# Patient Record
Sex: Female | Born: 1935 | Race: White | Hispanic: No | Marital: Married | Smoking: Never smoker
Health system: Southern US, Community
[De-identification: ages and names within clinical notes are randomized; demographics above are authoritative.]

## PROBLEM LIST (undated history)

## (undated) DIAGNOSIS — I83892 Varicose veins of left lower extremities with other complications: Secondary | ICD-10-CM

## (undated) DIAGNOSIS — E785 Hyperlipidemia, unspecified: Secondary | ICD-10-CM

## (undated) DIAGNOSIS — I6529 Occlusion and stenosis of unspecified carotid artery: Secondary | ICD-10-CM

## (undated) DIAGNOSIS — I10 Essential (primary) hypertension: Secondary | ICD-10-CM

## (undated) DIAGNOSIS — F419 Anxiety disorder, unspecified: Secondary | ICD-10-CM

## (undated) DIAGNOSIS — R011 Cardiac murmur, unspecified: Secondary | ICD-10-CM

## (undated) DIAGNOSIS — E041 Nontoxic single thyroid nodule: Secondary | ICD-10-CM

## (undated) DIAGNOSIS — M1990 Unspecified osteoarthritis, unspecified site: Secondary | ICD-10-CM

## (undated) DIAGNOSIS — E119 Type 2 diabetes mellitus without complications: Secondary | ICD-10-CM

## (undated) DIAGNOSIS — N189 Chronic kidney disease, unspecified: Secondary | ICD-10-CM

## (undated) DIAGNOSIS — C4491 Basal cell carcinoma of skin, unspecified: Secondary | ICD-10-CM

## (undated) HISTORY — DX: Chronic kidney disease, unspecified: N18.9

## (undated) HISTORY — DX: Essential (primary) hypertension: I10

## (undated) HISTORY — DX: Anxiety disorder, unspecified: F41.9

## (undated) HISTORY — DX: Type 2 diabetes mellitus without complications: E11.9

## (undated) HISTORY — DX: Varicose veins of left lower extremity with other complications: I83.892

## (undated) HISTORY — DX: Hyperlipidemia, unspecified: E78.5

## (undated) HISTORY — DX: Basal cell carcinoma of skin, unspecified: C44.91

## (undated) HISTORY — DX: Unspecified osteoarthritis, unspecified site: M19.90

## (undated) HISTORY — DX: Occlusion and stenosis of unspecified carotid artery: I65.29

## (undated) HISTORY — DX: Nontoxic single thyroid nodule: E04.1

## (undated) HISTORY — DX: Cardiac murmur, unspecified: R01.1

## (undated) HISTORY — PX: APPENDECTOMY: SHX54

## (undated) HISTORY — PX: TONSILLECTOMY: SUR1361

---

## 2010-03-09 ENCOUNTER — Encounter: Admission: RE | Admit: 2010-03-09 | Discharge: 2010-03-09 | Payer: Self-pay | Admitting: Neurological Surgery

## 2010-03-12 ENCOUNTER — Emergency Department (HOSPITAL_BASED_OUTPATIENT_CLINIC_OR_DEPARTMENT_OTHER): Admission: EM | Admit: 2010-03-12 | Discharge: 2010-03-12 | Payer: Self-pay | Admitting: Emergency Medicine

## 2010-04-12 ENCOUNTER — Ambulatory Visit (HOSPITAL_COMMUNITY): Admission: RE | Admit: 2010-04-12 | Discharge: 2010-04-13 | Payer: Self-pay | Admitting: Neurological Surgery

## 2011-02-05 LAB — CBC
HCT: 43.1 % (ref 36.0–46.0)
Hemoglobin: 14.8 g/dL (ref 12.0–15.0)
MCHC: 34.3 g/dL (ref 30.0–36.0)
MCV: 98.2 fL (ref 78.0–100.0)
WBC: 10 10*3/uL (ref 4.0–10.5)

## 2011-02-05 LAB — DIFFERENTIAL
Monocytes Absolute: 0.7 10*3/uL (ref 0.1–1.0)
Neutrophils Relative %: 76 % (ref 43–77)

## 2011-02-05 LAB — SURGICAL PCR SCREEN
MRSA, PCR: NEGATIVE
Staphylococcus aureus: NEGATIVE

## 2011-02-05 LAB — BASIC METABOLIC PANEL
BUN: 16 mg/dL (ref 6–23)
Glucose, Bld: 110 mg/dL — ABNORMAL HIGH (ref 70–99)

## 2011-02-05 LAB — PROTIME-INR
INR: 1 (ref 0.00–1.49)
Prothrombin Time: 13.1 seconds (ref 11.6–15.2)

## 2011-08-03 IMAGING — CT CT CERVICAL SPINE W/ CM
3 of 4 series · 15 of 28 positions shown, 17 images · non-contrast
Comparison: none

CLINICAL DATA: Neck pain.  Cervical radiculopathy.
TECHNIQUE: Contiguous axial images were obtained through the
Cervical spine without infusion. Coronal and sagittal
reconstructions were obtained of the axial image sets.

[Series 2: c spine bone · axial · 0.27mm/px · z∈[+12,+142]mm · 5 of 79 slices shown, 7 images]
[im 14/79  soft-tissue]
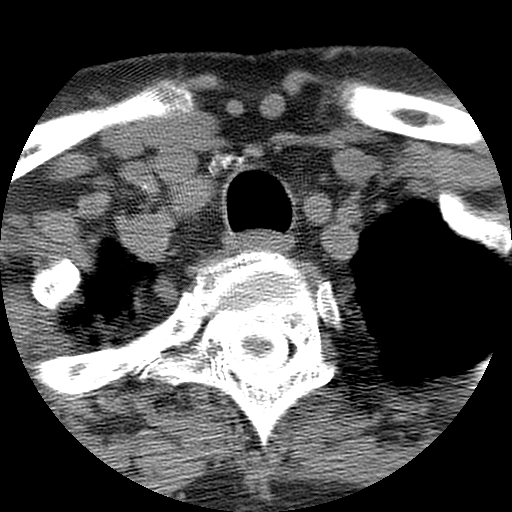
[im 14/79  bone]
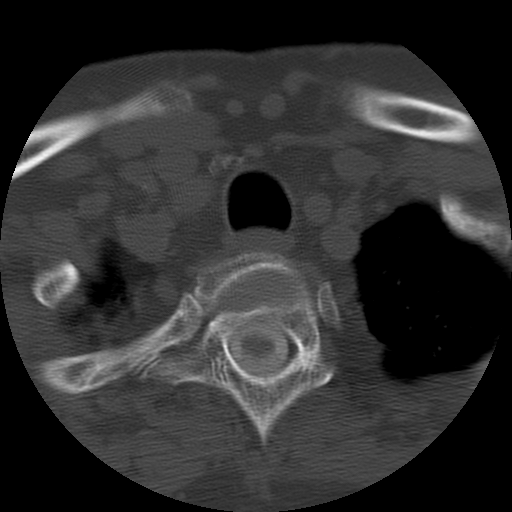
[im 27/79  bone]
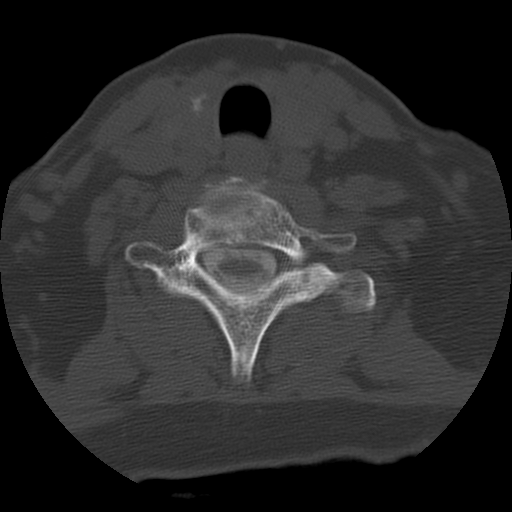
[im 40/79  bone]
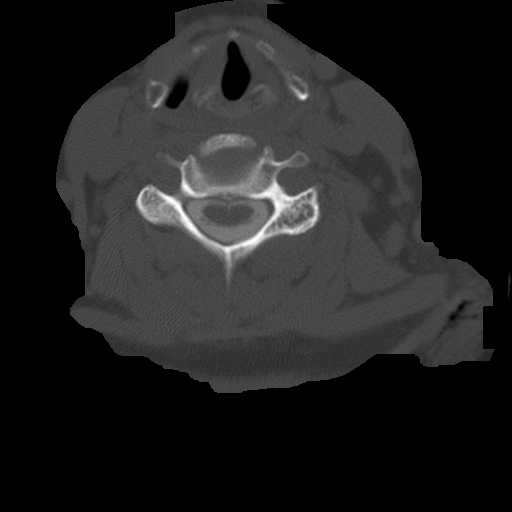
[im 53/79  bone]
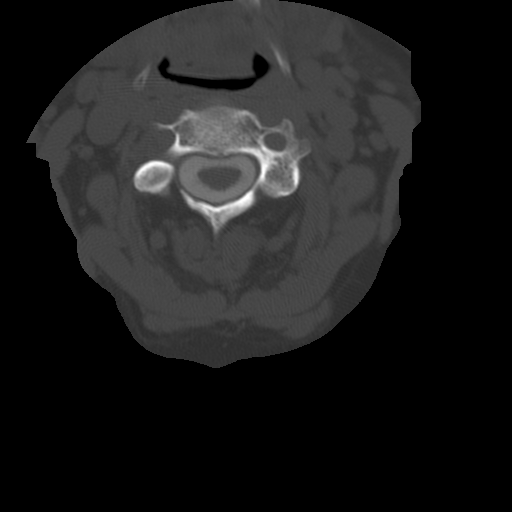
[im 66/79  soft-tissue]
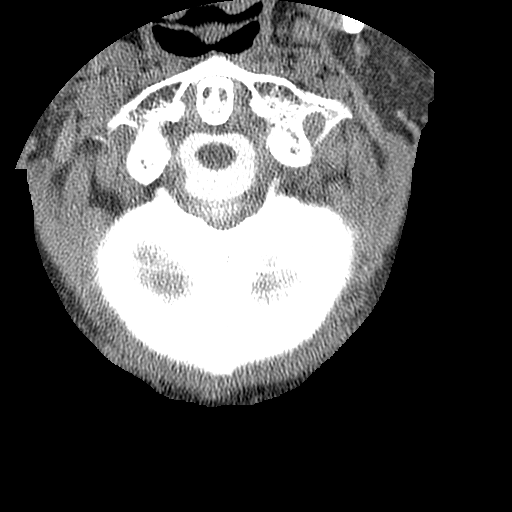
[im 66/79  bone]
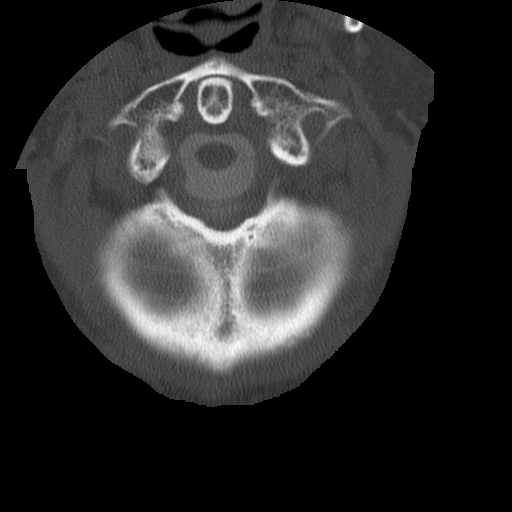

[Series 3: c spine soft · axial · 0.27mm/px · z∈[+12,+142]mm · 5 of 78 slices shown]
[im 13/78  soft-tissue]
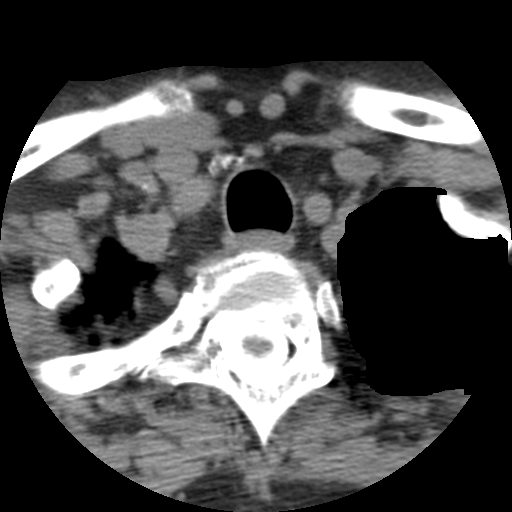
[im 26/78  soft-tissue]
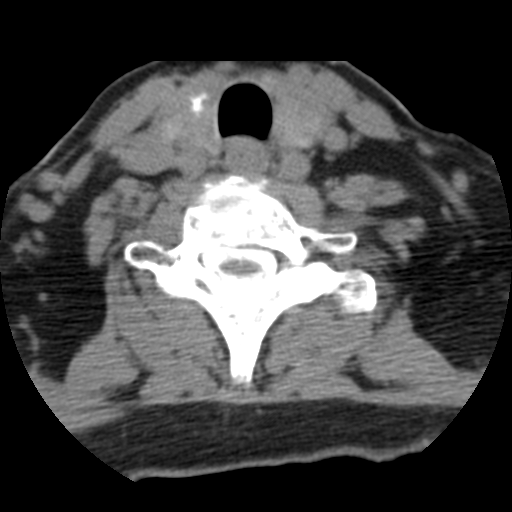
[im 39/78  soft-tissue]
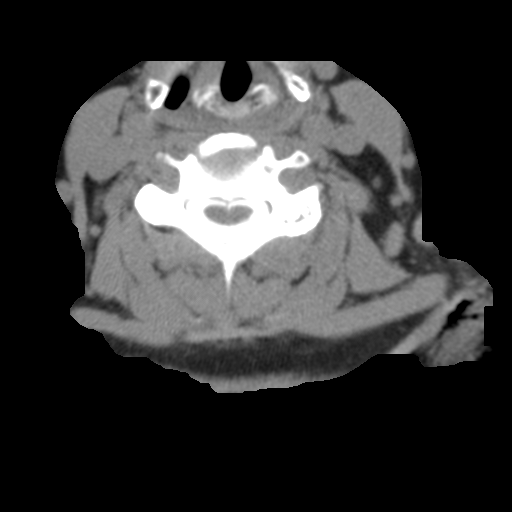
[im 52/78  soft-tissue]
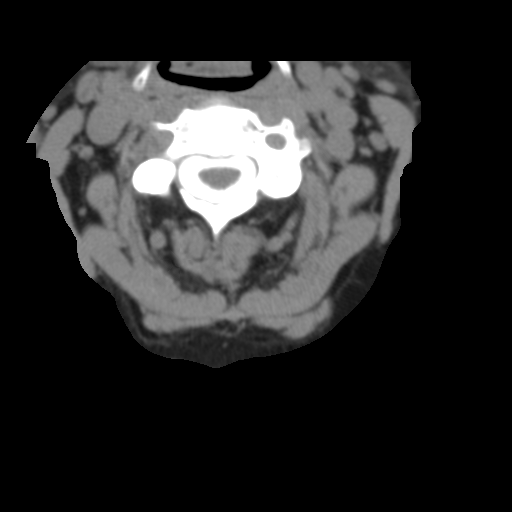
[im 65/78  soft-tissue]
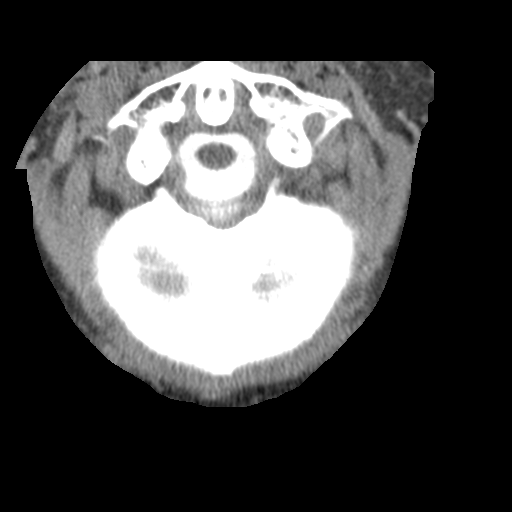

[Series 400: sag · sagittal · 0.39mm/px · 5 of 40 slices shown]
[im 7/40  bone]
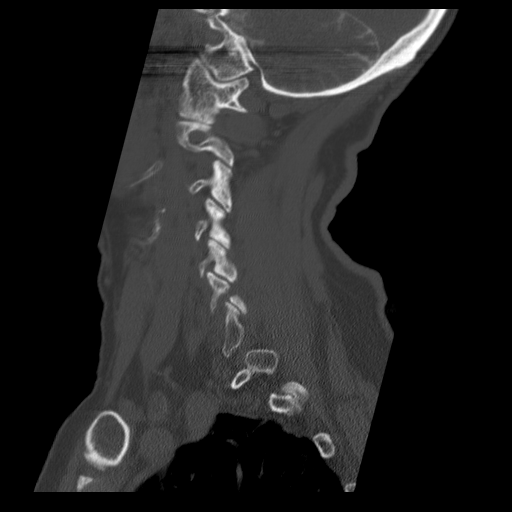
[im 14/40  bone]
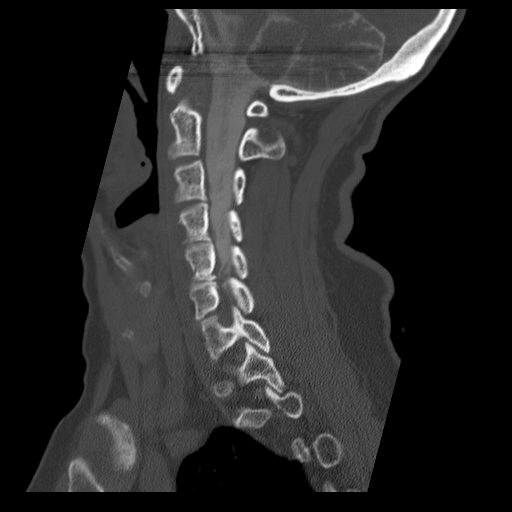
[im 20/40  bone]
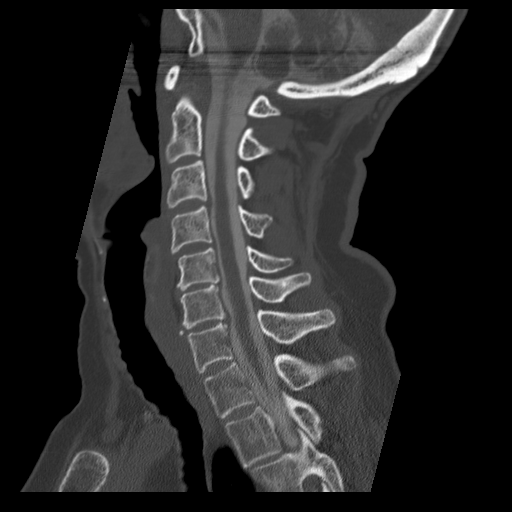
[im 27/40  bone]
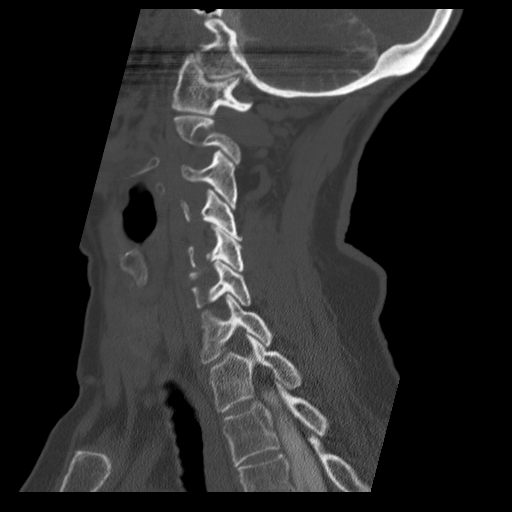
[im 33/40  bone]
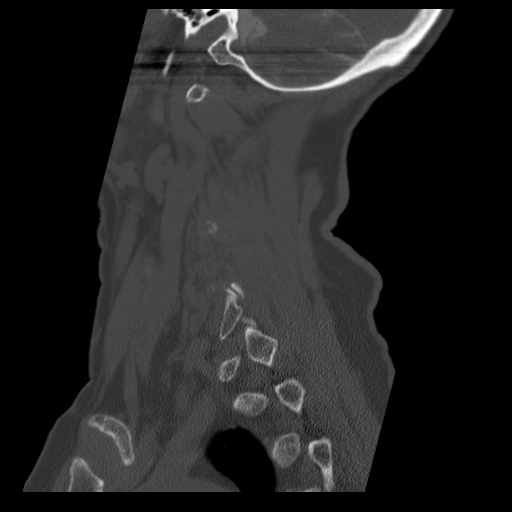

[15 of 28 positions shown; findings below may reference images not displayed]

LUMBAR PUNCTURE FOR CERVICAL MYELOGRAM

 Procedure: After thorough discussion of risks and benefits of the
procedure including bleeding, infection, injury to nerves, blood
vessels, adjacent structures as well as headache and CSF leak,
written and oral informed consent was obtained.   Consent was
obtained by Dr.Rajiv.

Patient was positioned prone on the fluoroscopy table. Local
anesthesia was provided with 1% lidocaine without epinephrine after
prepped and draped in the usual sterile fashion. Puncture was
performed at L4-L5 using a 3-1/2-inch Garcia Gonzalez inch 22-gauge spinal
needle via left paramedian approach.  Using a single pass through
the dura, the needle was placed within the thecal sac, with return
of clear CSF. 8 mL of Mmnipaque-1TT was injected into the thecal
sac, with normal opacification of the nerve roots and cauda equina
consistent with free flow within the subarachnoid space.

Fluoroscopy time: 1 minute 19 seconds.
FINDINGS: Shallow anterior extradural impression is present at C6-
C7.  Disc space loss at C5-C6 and C6-C7.  Disc height at C7-T1
appears preserved.  The cervical spondylosis with uncovertebral
spurring which will be discussed in conjunction with CT below.
IMPRESSION: 1.  Technically successful lumbar puncture for cervical myelogram.
2.  Mid to lower cervical spondylosis most pronounced at C6-C7.

CT CERVICAL MYELOGRAM
FINDINGS: 1 mm or 2 mm of anterolisthesis of C4 and C5 is present
associated with facet arthrosis.  Cervical cord demonstrates normal
caliber.  No critical spinal stenosis.  Craniocervical alignment is
normal.  No destructive osseous lesions are present.  Incidental
visualization of the paraspinal soft tissues demonstrate carotid
atherosclerosis.  Atlantodental osteoarthritis. Chunky
calcification is present in the right thyroid lobe without discrete
nodule.

C2-C3:  Central canal and foramina adequately patent.
C3-C4:  Shallow disc bulge and posterior osseous ridging.  Mild
impression on the thecal sac with minimal narrowing of the central
canal.  AP diameter is 9.8 mm.  Minimal bilateral uncovertebral
hypertrophy.
C4-C5:  Minimal anterolisthesis.  Posterior osseous ridging is
present.  AP diameter of the thecal sac is 11.8 mm.  Moderate to
severe left facet arthrosis with minimal left foraminal
encroachment.  Despite the arthrosis, the left foramen appears more
patent than the right.
C5-C6:  Degenerated disc with loss of height.  Left greater than
right facet hypertrophy.  Left greater than right foraminal
stenosis.  Mild central stenosis with AP diameter of the central
canal measuring 1 cm.  Bilateral uncovertebral spurring.
C6-C7:  Shallow broad-based posterior disc osteophyte complex is
present.  Mild to moderate central stenosis associated with disc
osteophyte complex.  AP diameter of the thecal sac is 9.6 mm.
Bilateral uncovertebral spurring is minimal with mild left
foraminal stenosis.
C7-T1:  No significant stenosis.  Upper thoracic levels also do not
show significant stenosis.
IMPRESSION: 1.  Mild to moderate mid to lower cervical spondylosis most
pronounced at C6-C7.  Broad-based posterior disc osteophyte complex
produces mild to moderate C6-C7 central stenosis.
2.  C5-C6 left greater than right uncovertebral hypertrophy and
foraminal stenosis.
3.  C4-C5 moderate to severe left facet arthrosis with minimal
anterolisthesis.  Minimal left foraminal encroachment.  Mild right
foraminal stenosis.
4.  Mild C3-C4 central stenosis associated with disc bulge.

## 2011-09-06 IMAGING — CR DG CHEST 2V
2 series · 2 of 2 positions shown · non-contrast
Comparison: None.

CLINICAL DATA: Preadmission chest x-ray for herniated disc surgery

CHEST - 2 VIEW

[view not recorded (1 of 2)]
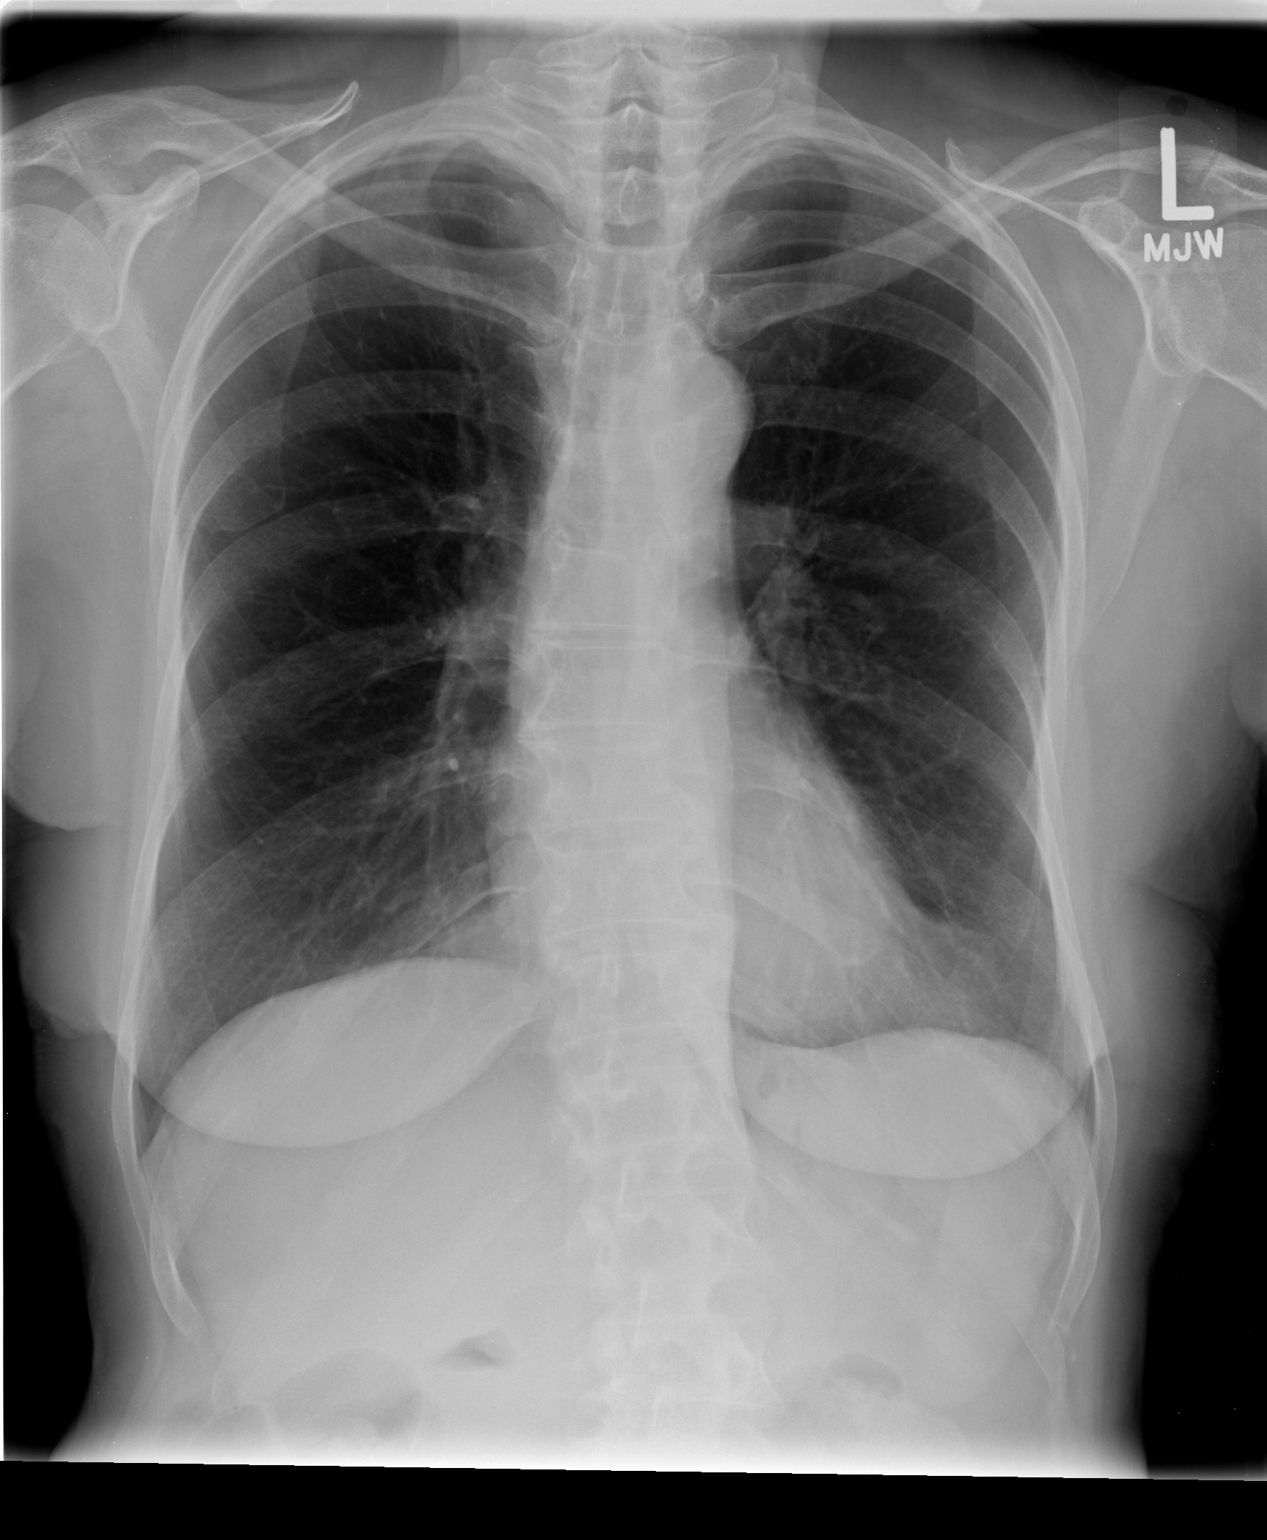

[view not recorded (2 of 2)]
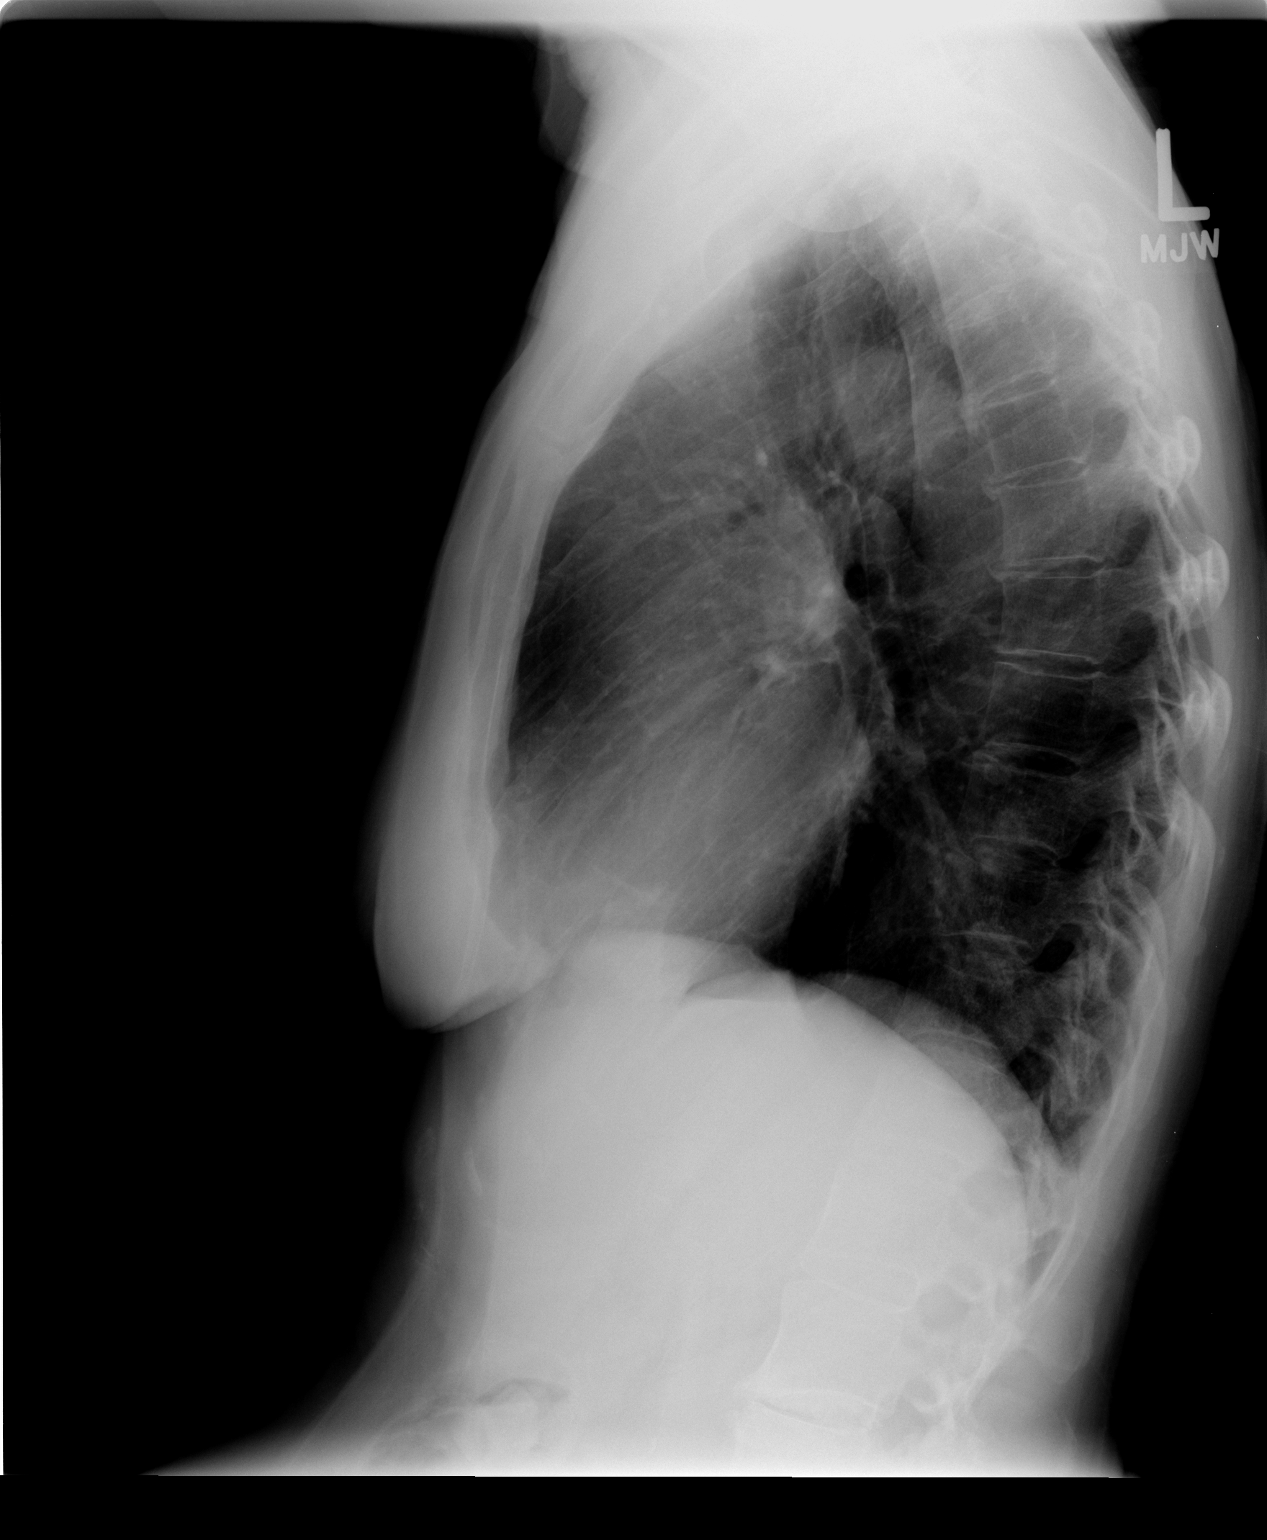

[2 of 2 positions shown; findings below may reference images not displayed]

FINDINGS: Lungs are hyperexpanded without edema, pneumonia, or
pleural effusion. The cardiopericardial silhouette is within normal
limits for size. Imaged bony structures of the thorax are intact.
IMPRESSION: Emphysema without acute cardiopulmonary findings.

## 2017-01-25 ENCOUNTER — Encounter: Payer: Self-pay | Admitting: Vascular Surgery

## 2017-01-25 ENCOUNTER — Other Ambulatory Visit: Payer: Self-pay

## 2017-01-30 ENCOUNTER — Encounter: Payer: Self-pay | Admitting: Vascular Surgery

## 2017-01-31 ENCOUNTER — Encounter: Payer: Self-pay | Admitting: Vascular Surgery

## 2017-02-12 ENCOUNTER — Ambulatory Visit (INDEPENDENT_AMBULATORY_CARE_PROVIDER_SITE_OTHER): Payer: Medicare Other | Admitting: Vascular Surgery

## 2017-02-12 ENCOUNTER — Encounter: Payer: Self-pay | Admitting: Vascular Surgery

## 2017-02-12 VITALS — BP 158/65 | HR 62 | Temp 97.0°F | Resp 18 | Ht 62.0 in | Wt 137.5 lb

## 2017-02-12 DIAGNOSIS — I83892 Varicose veins of left lower extremities with other complications: Secondary | ICD-10-CM

## 2017-02-12 NOTE — Progress Notes (Signed)
Subjective:     Patient ID: Krista Mullen, female   DOB: 1927-05-07, 83 y.o.   MRN: [id number]  HPI This 83 year old female was referred for evaluation of painful varicosities in the left leg. She has no history of DVT thrombophlebitis stasis ulcers or bleeding. She over the past few weeks has developed increasing discomfort in the posterior left calf over an varicosities. She has never had pain in this area before. She has no symptoms in the right leg. She does not were elastic compression stockings. She is able to ambulate short distances. She lives alone. She does not take anticoagulants.  Past Medical History:  Diagnosis Date  . Anxiety   . Basal cell carcinoma   . Carotid artery occlusion   . Chronic kidney disease   . Diabetes mellitus without complication ([place])   . DJD (degenerative joint disease)   . Heart murmur   . Hyperlipidemia   . Hypertension   . Thyroid nodule    multiple  . Varicose veins of left lower extremity with complications     Social History  Substance Use Topics  . Smoking status: Never Smoker  . Smokeless tobacco: Never Used  . Alcohol use No    History reviewed. No pertinent family history.  Allergies  Allergen Reactions  . Dilaudid [Hydromorphone Hcl]      Current Outpatient Prescriptions:  .  ALPRAZolam (XANAX) 0.25 MG tablet, Take 0.25 mg by mouth at bedtime as needed for anxiety., Disp: , Rfl:  .  amLODipine (NORVASC) 5 MG tablet, Take 5 mg by mouth daily., Disp: , Rfl:  .  aspirin 81 MG tablet, Take 81 mg by mouth daily., Disp: , Rfl:  .  calcium-vitamin D (OSCAL WITH D) 500-200 MG-UNIT tablet, Take 1 tablet by mouth 2 (two) times daily., Disp: , Rfl:  .  Cholecalciferol (VITAMIN D3) 3000 units TABS, Take 1,000 mg by mouth daily., Disp: , Rfl:  .  ezetimibe (ZETIA) 10 MG tablet, Take 10 mg by mouth daily., Disp: , Rfl:  .  FENOFIBRATE PO, Take by mouth daily., Disp: , Rfl:  .  gabapentin (NEURONTIN) 100 MG capsule, Take 100 mg by mouth 2  (two) times daily., Disp: , Rfl:  .  lisinopril (PRINIVIL,ZESTRIL) 10 MG tablet, Take 10 mg by mouth daily., Disp: , Rfl:  .  metoprolol succinate (TOPROL-XL) 50 MG 24 hr tablet, Take 50 mg by mouth daily. Take with or immediately following a meal., Disp: , Rfl:  .  Multiple Vitamins-Minerals (EYE VITAMINS PO), Take by mouth., Disp: , Rfl:  .  Omega-3 Fatty Acids (FISH OIL PO), Take by mouth., Disp: , Rfl:  .  sertraline (ZOLOFT) 25 MG tablet, Take 25 mg by mouth daily., Disp: , Rfl:  .  Choline Fenofibrate (TRILIPIX) 135 MG capsule, Take 135 mg by mouth daily., Disp: , Rfl:  .  loratadine (CLARITIN) 10 MG tablet, Take 10 mg by mouth daily., Disp: , Rfl:  .  metFORMIN (GLUCOPHAGE) 500 MG tablet, Take 500 mg by mouth at bedtime., Disp: , Rfl:  .  metroNIDAZOLE (METROGEL) 0.75 % gel, Apply 1 application topically 2 (two) times daily., Disp: , Rfl:  .  Multiple Vitamin (MULTIVITAMIN) tablet, Take 1 tablet by mouth daily., Disp: , Rfl:   Vitals:   02/12/17 1443 02/12/17 1444  BP: (!) 169/67 (!) 158/65  Pulse: 62   Resp: 18   Temp: 97 F (36.1 C)   TempSrc: Oral   SpO2: 96%   Weight: 137 lb 8  oz (62.4 kg)   Height: 5\' 2"  (1.575 m)     Body mass index is 25.15 kg/m.         Review of Systems Denies chest pain, dyspnea on exertion, PND, orthopnea, hemoptysis    Objective:   Physical Exam BP (!) 158/65 (BP Location: Left Arm, Patient Position: Sitting, Cuff Size: Normal)   Pulse 62   Temp 97 F (36.1 C) (Oral)   Resp 18   Ht 5\' 2"  (1.575 m)   Wt 137 lb 8 oz (62.4 kg)   SpO2 96%   BMI 25.15 kg/m     Gen.-alert and oriented x3 in no apparent distress-elderly and frail in appearance HEENT normal for age Lungs no rhonchi or wheezing Cardiovascular regular rhythm no murmurs carotid pulses 3+ palpable no bruits audible Abdomen soft nontender no palpable masses Musculoskeletal free of  major deformities Skin clear -no rashes Neurologic normal Lower extremities 3+ femoral  and dorsalis pedis pulses palpable bilaterally with no edema-bilaterally Left leg with 3 x 2 cm mass of reticular veins in the posterior lateral calf about 5 cm distal to the popliteal crease. No inflammation. Prominent vein with a few small varicosities in the posterior thigh extending up medially. Prominent pretibial vein anteriorly. No hyperpigmentation or ulceration distally.  Today I performed a bedside SonoSite ultrasound exam which revealed that the great saphenous vein on the left is slightly enlarged proximally with no significant reflux but tapers down distally. The small saphenous vein does not appear to have reflux.       Assessment:     Localized area of reticular veins left posterior calf causing localized discomfort 2-3 weeks-no history of DVT or thrombophlebitis    Plan:     1 short leg elastic compression stockings #2 if pain persists she will return in 3 months and a formal venous ultrasound of the left leg will be obtained If her discomfort is tolerable or resolves then she will cancel the appointment and the ultrasound I doubt that any treatment will be necessary

## 2017-02-13 ENCOUNTER — Encounter: Payer: Self-pay | Admitting: Family Medicine

## 2017-02-13 NOTE — Addendum Note (Signed)
Addended by: Lianne Cure A on: 02/13/2017 09:20 AM   Modules accepted: Orders

## 2017-05-07 ENCOUNTER — Ambulatory Visit: Payer: Medicare Other | Admitting: Vascular Surgery

## 2017-05-07 ENCOUNTER — Encounter (HOSPITAL_COMMUNITY): Payer: Medicare Other

## 2020-12-20 DEATH — deceased
